# Patient Record
Sex: Male | Born: 1960
Health system: Southern US, Community
[De-identification: ages and names within clinical notes are randomized; demographics above are authoritative.]

## PROBLEM LIST (undated history)

## (undated) DIAGNOSIS — I10 Essential (primary) hypertension: Secondary | ICD-10-CM

---

## 2010-10-31 ENCOUNTER — Ambulatory Visit (INDEPENDENT_AMBULATORY_CARE_PROVIDER_SITE_OTHER): Payer: 59

## 2010-10-31 ENCOUNTER — Inpatient Hospital Stay (INDEPENDENT_AMBULATORY_CARE_PROVIDER_SITE_OTHER)
Admission: RE | Admit: 2010-10-31 | Discharge: 2010-10-31 | Disposition: A | Discharge status: Home / Self Care | Payer: 59 | Source: Ambulatory Visit | Attending: Family Medicine | Admitting: Family Medicine

## 2010-10-31 DIAGNOSIS — S93409A Sprain of unspecified ligament of unspecified ankle, initial encounter: Secondary | ICD-10-CM

## 2013-09-14 ENCOUNTER — Emergency Department (INDEPENDENT_AMBULATORY_CARE_PROVIDER_SITE_OTHER): Payer: 59

## 2013-09-14 ENCOUNTER — Encounter (HOSPITAL_COMMUNITY): Payer: Self-pay | Admitting: Emergency Medicine

## 2013-09-14 ENCOUNTER — Emergency Department (INDEPENDENT_AMBULATORY_CARE_PROVIDER_SITE_OTHER)
Admission: EM | Admit: 2013-09-14 | Discharge: 2013-09-14 | Disposition: A | Discharge status: Home / Self Care | Payer: 59 | Source: Home / Self Care | Attending: Family Medicine | Admitting: Family Medicine

## 2013-09-14 DIAGNOSIS — M25579 Pain in unspecified ankle and joints of unspecified foot: Secondary | ICD-10-CM

## 2013-09-14 DIAGNOSIS — M25571 Pain in right ankle and joints of right foot: Secondary | ICD-10-CM

## 2013-09-14 HISTORY — DX: Essential (primary) hypertension: I10

## 2013-09-14 MED ORDER — INDOMETHACIN 25 MG PO CAPS: 25.0000 mg | ORAL_CAPSULE | Freq: Three times a day (TID) | ORAL | Status: AC | PRN

## 2013-09-14 NOTE — Discharge Instructions (Signed)
Ankle Pain Ankle pain is a common symptom. The bones, cartilage, tendons, and muscles of the ankle joint perform a lot of work each day. The ankle joint holds your body weight and allows you to move around. Ankle pain can occur on either side or back of 1 or both ankles. Ankle pain may be sharp and burning or dull and aching. There may be tenderness, stiffness, redness, or warmth around the ankle. The pain occurs more often when a person walks or puts pressure on the ankle. CAUSES  There are many reasons ankle pain can develop. It is important to work with your caregiver to identify the cause since many conditions can impact the bones, cartilage, muscles, and tendons. Causes for ankle pain include:  Injury, including a break (fracture), sprain, or strain often due to a fall, sports, or a high-impact activity.  Swelling (inflammation) of a tendon (tendonitis).  Achilles tendon rupture.  Ankle instability after repeated sprains and strains.  Poor foot alignment.  Pressure on a nerve (tarsal tunnel syndrome).  Arthritis in the ankle or the lining of the ankle.  Crystal formation in the ankle (gout or pseudogout). DIAGNOSIS  A diagnosis is based on your medical history, your symptoms, results of your physical exam, and results of diagnostic tests. Diagnostic tests may include X-ray exams or a computerized magnetic scan (magnetic resonance imaging, MRI). TREATMENT  Treatment will depend on the cause of your ankle pain and may include:  Keeping pressure off the ankle and limiting activities.  Using crutches or other walking support (a cane or brace).  Using rest, ice, compression, and elevation.  Participating in physical therapy or home exercises.  Wearing shoe inserts or special shoes.  Losing weight.  Taking medications to reduce pain or swelling or receiving an injection.  Undergoing surgery. HOME CARE INSTRUCTIONS   Only take over-the-counter or prescription medicines for  pain, discomfort, or fever as directed by your caregiver.  Put ice on the injured area.  Put ice in a plastic bag.  Place a towel between your skin and the bag.  Leave the ice on for 15-20 minutes at a time, 03-04 times a day.  Keep your leg raised (elevated) when possible to lessen swelling.  Avoid activities that cause ankle pain.  Follow specific exercises as directed by your caregiver.  Record how often you have ankle pain, the location of the pain, and what it feels like. This information may be helpful to you and your caregiver.  Ask your caregiver about returning to work or sports and whether you should drive.  Follow up with your caregiver for further examination, therapy, or testing as directed. SEEK MEDICAL CARE IF:   Pain or swelling continues or worsens beyond 1 week.  You have an oral temperature above 102 F (38.9 C).  You are feeling unwell or have chills.  You are having an increasingly difficult time with walking.  You have loss of sensation or other new symptoms.  You have questions or concerns. MAKE SURE YOU:   Understand these instructions.  Will watch your condition.  Will get help right away if you are not doing well or get worse. Document Released: 12/18/2009 Document Revised: 09/22/2011 Document Reviewed: 12/18/2009 [facility] Patient Information 2014 [place], [place].  Arthralgia Your caregiver has diagnosed you as suffering from an arthralgia. Arthralgia means there is pain in a joint. This can come from many reasons including:  Bruising the joint which causes soreness (inflammation) in the joint.  Wear and tear on  the joints which occur as we grow older (osteoarthritis).  Overusing the joint.  Various forms of arthritis.  Infections of the joint. Regardless of the cause of pain in your joint, most of these different pains respond to anti-inflammatory drugs and rest. The exception to this is when a joint is infected, and these cases are  treated with antibiotics, if it is a bacterial infection. HOME CARE INSTRUCTIONS   Rest the injured area for as long as directed by your caregiver. Then slowly start using the joint as directed by your caregiver and as the pain allows. Crutches as directed may be useful if the ankles, knees or hips are involved. If the knee was splinted or casted, continue use and care as directed. If an stretchy or elastic wrapping bandage has been applied today, it should be removed and re-applied every 3 to 4 hours. It should not be applied tightly, but firmly enough to keep swelling down. Watch toes and feet for swelling, bluish discoloration, coldness, numbness or excessive pain. If any of these problems (symptoms) occur, remove the ace bandage and re-apply more loosely. If these symptoms persist, contact your caregiver or return to this location.  For the first 24 hours, keep the injured extremity elevated on pillows while lying down.  Apply ice for 15-20 minutes to the sore joint every couple hours while awake for the first half day. Then 03-04 times per day for the first 48 hours. Put the ice in a plastic bag and place a towel between the bag of ice and your skin.  Wear any splinting, casting, elastic bandage applications, or slings as instructed.  Only take over-the-counter or prescription medicines for pain, discomfort, or fever as directed by your caregiver. Do not use aspirin immediately after the injury unless instructed by your physician. Aspirin can cause increased bleeding and bruising of the tissues.  If you were given crutches, continue to use them as instructed and do not resume weight bearing on the sore joint until instructed. Persistent pain and inability to use the sore joint as directed for more than 2 to 3 days are warning signs indicating that you should see a caregiver for a follow-up visit as soon as possible. Initially, a hairline fracture (break in bone) may not be evident on X-rays.  Persistent pain and swelling indicate that further evaluation, non-weight bearing or use of the joint (use of crutches or slings as instructed), or further X-rays are indicated. X-rays may sometimes not show a small fracture until a week or 10 days later. Make a follow-up appointment with your own caregiver or one to whom we have referred you. A radiologist (specialist in reading X-rays) may read your X-rays. Make sure you know how you are to obtain your X-ray results. Do not assume everything is normal if you do not hear from [place]. SEEK MEDICAL CARE IF: Bruising, swelling, or pain increases. SEEK IMMEDIATE MEDICAL CARE IF:   Your fingers or toes are numb or blue.  The pain is not responding to medications and continues to stay the same or get worse.  The pain in your joint becomes severe.  You develop a fever over 102 F (38.9 C).  It becomes impossible to move or use the joint. MAKE SURE YOU:   Understand these instructions.  Will watch your condition.  Will get help right away if you are not doing well or get worse. Document Released: 06/30/2005 Document Revised: 09/22/2011 Document Reviewed: 02/16/2008 [facility] Patient Information 2014 [place], [place].  Your  xrays were without evidence of bony destruction or injury. I would advise continued use of splint as needed and ice & elevation for swelling along with indocin as prescribed. If symptoms do not begin to improve or if they re-occur, please follow up with the orthopedist listed on your discharge paperwork or the orthopedist of your choice.

## 2013-09-14 NOTE — ED Provider Notes (Signed)
CSN: [id number]     Arrival date & time 09/14/13  1129 History   First MD Initiated Contact with Patient 09/14/13 1243     Chief Complaint  Patient presents with  . Foot Pain   (Consider location/radiation/quality/duration/timing/severity/associated sxs/prior Treatment) HPI Comments: Patient reports he works as an [employer] fund[employer] and over the past 4 days he has experience progressive discomfort and swelling of right lateral ankle with ambulation. Endorses that he has been evaluated for gout in the past and was told that he does not have gout. Has been using ASO, NSAIDs and RICE therapy with some relief. Denies previous fracture or surgery but does endorse multiple sprains of right ankle as young adult.   Patient is a 53 y.o. male presenting with ankle pain. The history is provided by the patient.  Ankle Pain Location:  Ankle Time since incident:  4 days Injury: no   Ankle location:  R ankle Pain details:    Quality:  Aching   Radiates to:  Does not radiate   Severity:  Mild   Onset quality:  Gradual   Duration:  4 days   Timing:  Constant Chronicity:  Recurrent Dislocation: no   Foreign body present:  No foreign bodies Prior injury to area:  Yes Relieved by:  NSAIDs, ice and elevation Worsened by:  Bearing weight Associated symptoms: swelling   Associated symptoms: no fever and no muscle weakness     Past Medical History  Diagnosis Date  . Hypertension    History reviewed. No pertinent past surgical history. History reviewed. No pertinent family history. History  Substance Use Topics  . Smoking status: Never Smoker   . Smokeless tobacco: Not on file  . Alcohol Use: No    Review of Systems  Constitutional: Negative for fever.  All other systems reviewed and are negative.    Allergies  Review of patient's allergies indicates no known allergies.  Home Medications   Current Outpatient Rx  Name  Route  Sig  Dispense  Refill  . hydrochlorothiazide (HYDRODIURIL) 25 MG tablet  Oral   Take 25 mg by mouth daily.         Marland Kitchen. lisinopril (PRINIVIL,ZESTRIL) 40 MG tablet   Oral   Take 40 mg by mouth daily.         . metoprolol succinate (TOPROL-XL) 25 MG 24 hr tablet   Oral   Take 25 mg by mouth daily.         . indomethacin (INDOCIN) 25 MG capsule   Oral   Take 1 capsule (25 mg total) by mouth 3 (three) times daily as needed for mild pain or moderate pain.   30 capsule   0    BP 155/63  Pulse 71  Temp(Src) 98.5 F (36.9 C) (Oral)  Resp 17  SpO2 100% Physical Exam  Nursing note and vitals reviewed. Constitutional: He is oriented to person, place, and time. He appears well-developed and well-nourished. No distress.  HENT:  Head: Normocephalic and atraumatic.  Eyes: Conjunctivae are normal.  Cardiovascular: Normal rate.   Pulmonary/Chest: Effort normal.  Musculoskeletal: Normal range of motion.       Right ankle: He exhibits swelling. He exhibits normal range of motion, no ecchymosis, no deformity, no laceration and normal pulse. Tenderness. Lateral malleolus tenderness found. No medial malleolus tenderness found. Achilles tendon normal.       Feet:  Area outlined on diagram is area of discomfort and STS. No clinical indication of gouty arthritis or cellulitis. No erythema,  induration or ecchymosis.  Neurological: He is alert and oriented to person, place, and time.  Skin: Skin is warm and dry. No rash noted.  Psychiatric: He has a normal mood and affect. His behavior is normal.    ED Course  Procedures (including critical care time) Labs Review Labs Reviewed - No data to display Imaging Review Dg Ankle Complete Right  09/14/2013   CLINICAL DATA:  Right ankle pain  EXAM: RIGHT ANKLE - COMPLETE 3+ VIEW  COMPARISON:  None.  FINDINGS: There is no evidence of fracture, dislocation, or joint effusion. There is no evidence of arthropathy or other focal bone abnormality. There is soft tissue swelling overlying the lateral malleolus.  IMPRESSION: No acute  osseous injury of the right ankle.   Electronically Signed   By: Elige Ko   On: 09/14/2013 13:19     MDM   1. Right ankle pain    Right ankle discomfort: will advise continued use of ASO and RICE therapy as needed for comfort. Will add Rx for indocin for pain and provide contact information for orthopedic follow up should patient wish to pursue additional evaluation/testing.    Jess Barters [place], [place] 09/14/13 1335

## 2013-09-14 NOTE — ED Notes (Signed)
C/o pain in right foot for past couple of days

## 2013-09-16 NOTE — ED Provider Notes (Signed)
Medical screening examination/treatment/procedure(s) were performed by resident physician or non-physician practitioner and as supervising physician I was immediately available for consultation/collaboration.   Barkley BrunsKINDL,JAMES DOUGLAS MD.   Linna HoffJames D Kindl, MD 09/16/13 [phone]

## 2015-07-17 MED FILL — METOPROLOL SUCC ER 25 MG TA: 25 | 30 days supply | Qty: 30 | Fill #6

## 2015-07-17 MED FILL — LISINOPRIL-HCTZ 20-12.5 MG: 20-12.5 | 30 days supply | Qty: 60 | Fill #6

## 2015-08-15 MED FILL — METOPROLOL SUCC ER 25 MG TA: 25 | 30 days supply | Qty: 30 | Fill #7

## 2015-08-15 MED FILL — LISINOPRIL-HCTZ 20-12.5 MG: 20-12.5 | 30 days supply | Qty: 60 | Fill #7

## 2015-09-27 MED FILL — LISINOPRIL-HCTZ 20-12.5 MG: 20-12.5 | 30 days supply | Qty: 60 | Fill #0

## 2015-09-27 MED FILL — METOPROLOL SUCC ER 25 MG TA: 25 | 30 days supply | Qty: 30 | Fill #0

## 2015-10-12 IMAGING — CR DG ANKLE COMPLETE 3+V*R*
4 series · 4 of 4 positions shown · non-contrast
Comparison: None.

CLINICAL DATA: Right ankle pain

EXAM:
RIGHT ANKLE - COMPLETE 3+ VIEW

[view not recorded (1 of 4)]
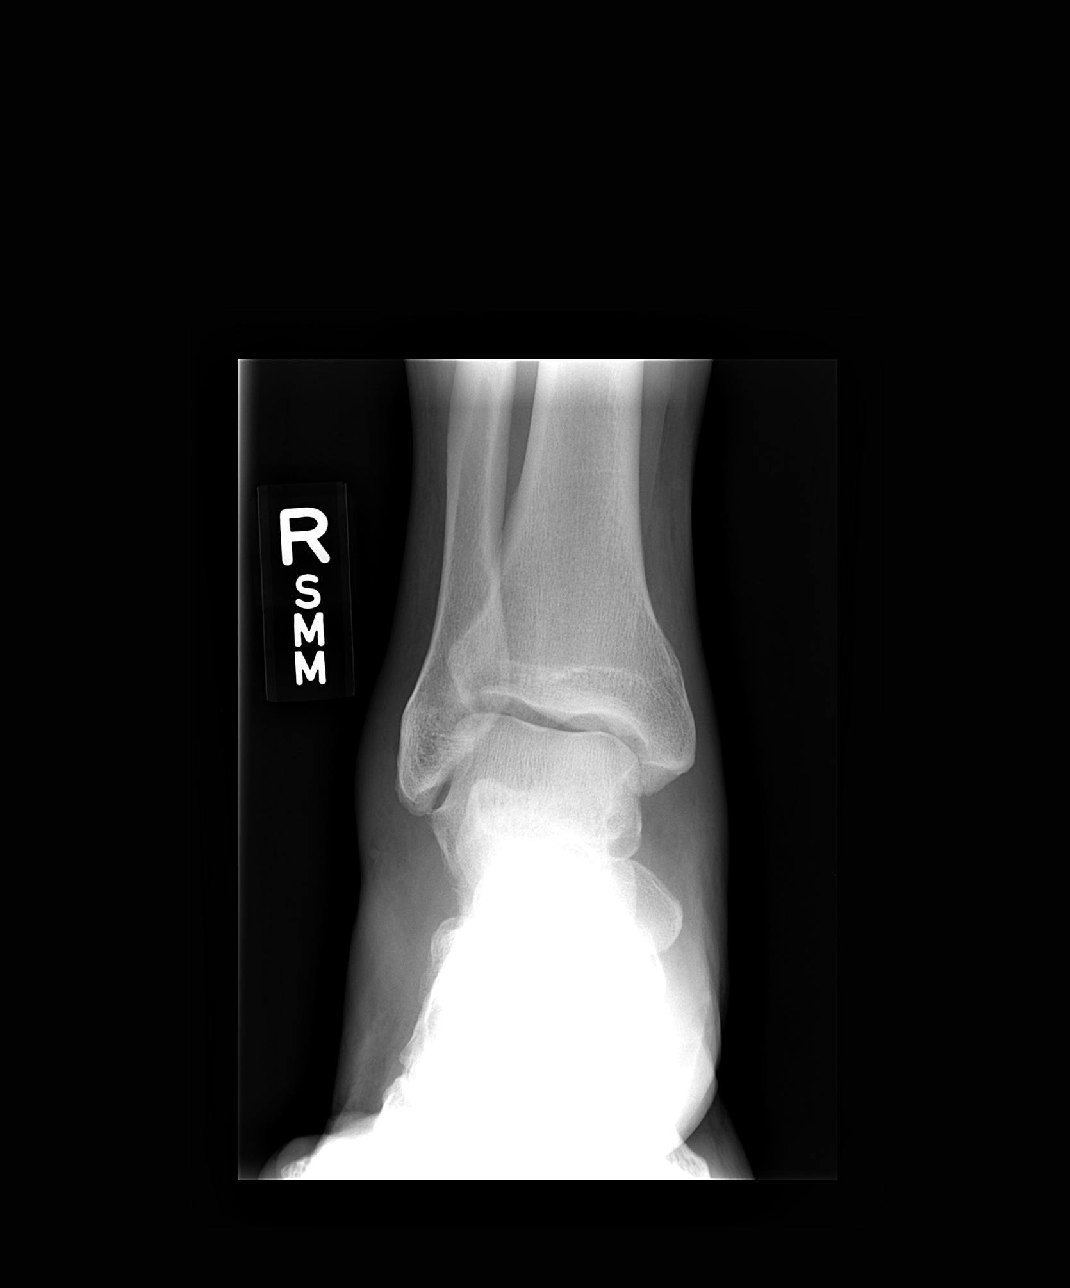

[view not recorded (2 of 4)]
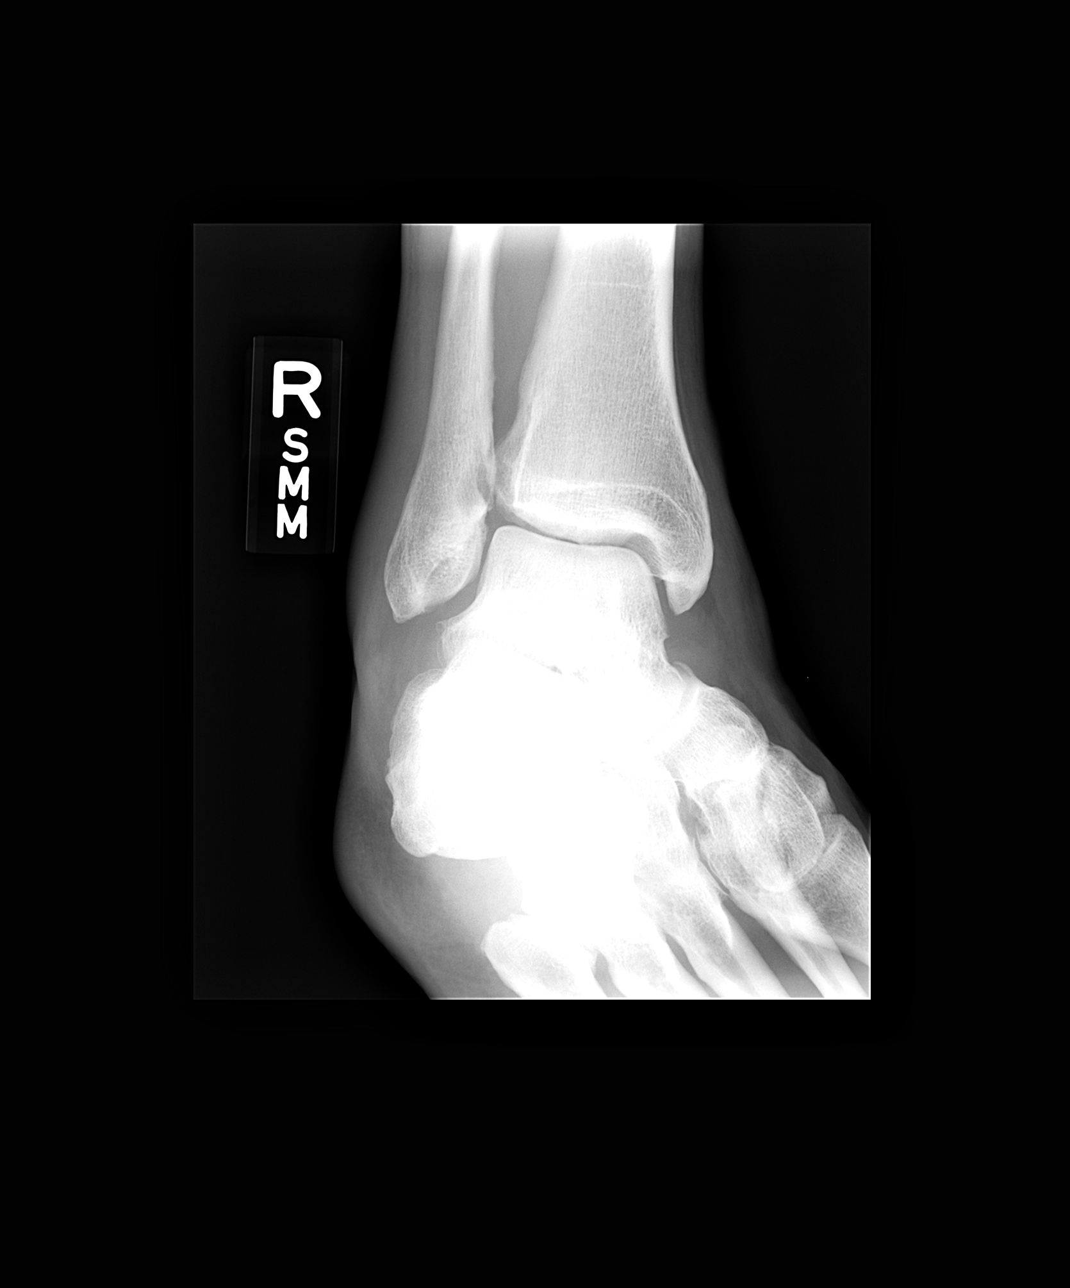

[view not recorded (3 of 4)]
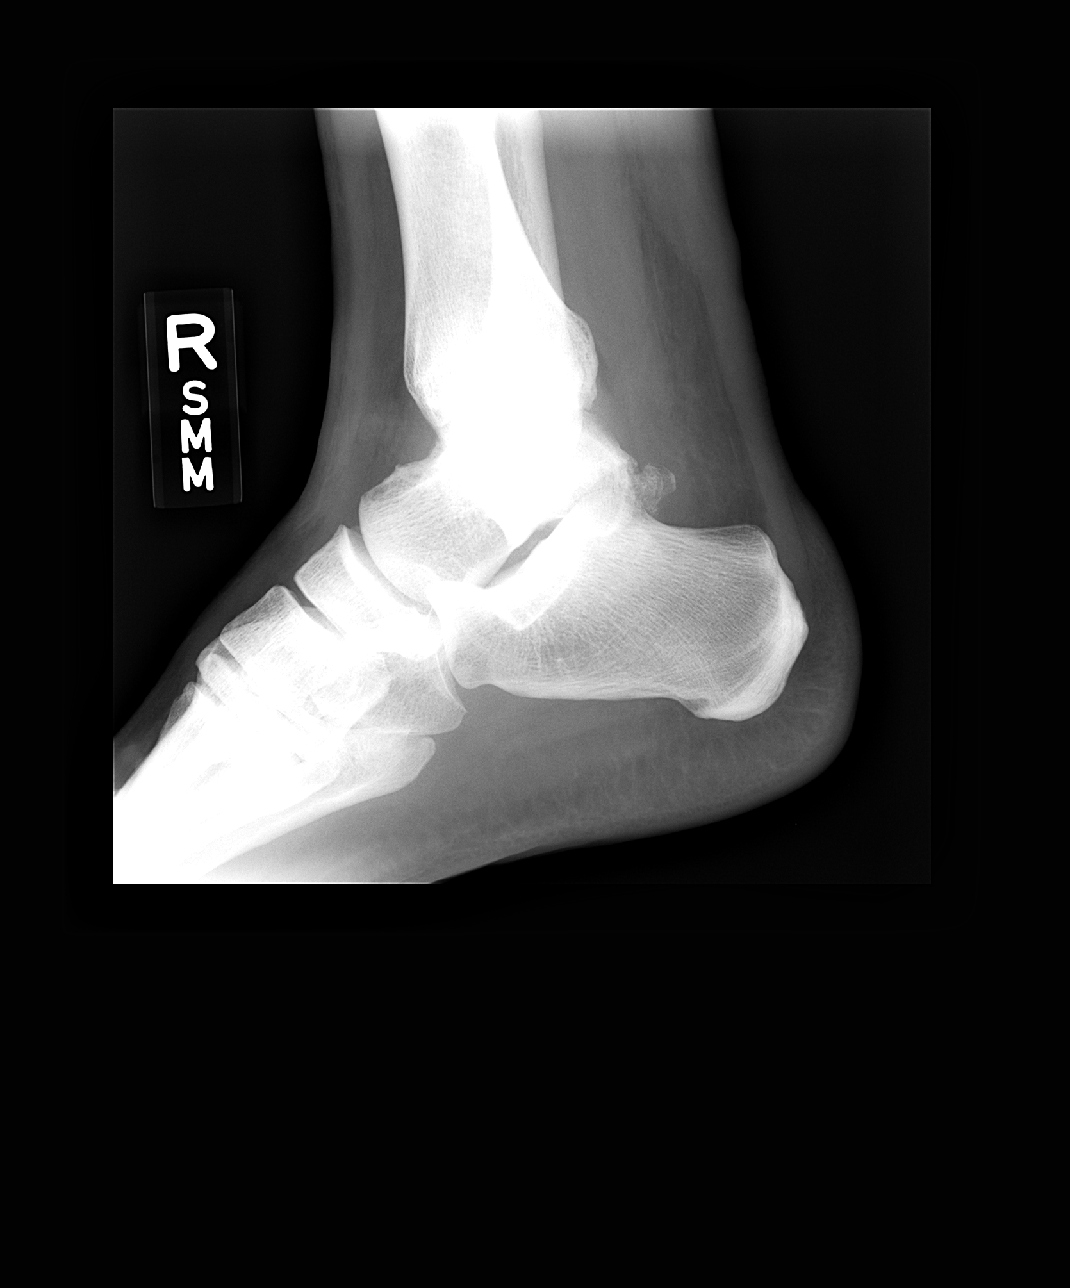

[view not recorded (4 of 4)]
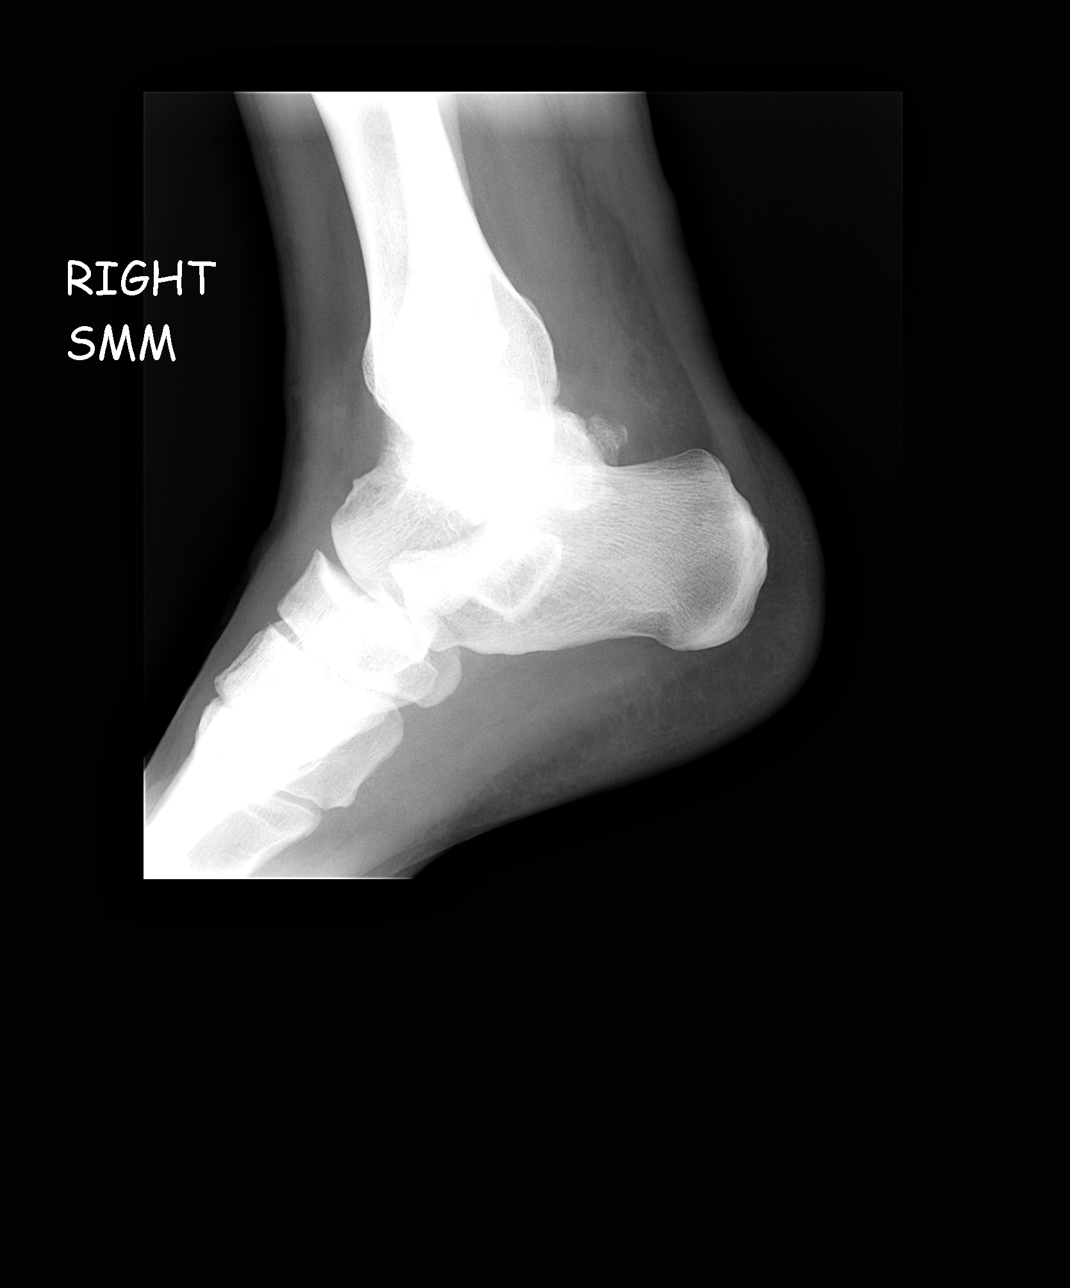

[4 of 4 positions shown; findings below may reference images not displayed]

FINDINGS: There is no evidence of fracture, dislocation, or joint effusion.
There is no evidence of arthropathy or other focal bone abnormality.
There is soft tissue swelling overlying the lateral malleolus.
IMPRESSION: No acute osseous injury of the right ankle.

## 2022-05-22 ENCOUNTER — Other Ambulatory Visit (HOSPITAL_COMMUNITY): Payer: Self-pay

## 2022-05-22 ENCOUNTER — Other Ambulatory Visit (HOSPITAL_BASED_OUTPATIENT_CLINIC_OR_DEPARTMENT_OTHER): Payer: Self-pay

## 2022-05-22 MED ORDER — COMIRNATY 30 MCG/0.3ML IM SUSY
PREFILLED_SYRINGE | INTRAMUSCULAR | 0 refills | Status: AC
  Filled 2022-05-22: qty 0.3, 1d supply, fill #0
